# Patient Record
Sex: Male | Born: 1995 | Race: White | Hispanic: No | Marital: Single | Smoking: Current every day smoker
Health system: Southern US, Community
[De-identification: ages and names within clinical notes are randomized; demographics above are authoritative.]

## PROBLEM LIST (undated history)

## (undated) HISTORY — PX: TONSILLECTOMY: SUR1361

---
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
## 2017-01-04 ENCOUNTER — Emergency Department
Admission: EM | Admit: 2017-01-04 | Discharge: 2017-01-04 | Disposition: A | Discharge status: Home / Self Care | Payer: 59 | Attending: Emergency Medicine | Admitting: Emergency Medicine

## 2017-01-04 ENCOUNTER — Emergency Department: Payer: 59

## 2017-01-04 ENCOUNTER — Encounter: Payer: Self-pay | Admitting: Emergency Medicine

## 2017-01-04 DIAGNOSIS — R55 Syncope and collapse: Secondary | ICD-10-CM | POA: Diagnosis not present

## 2017-01-04 DIAGNOSIS — R1013 Epigastric pain: Secondary | ICD-10-CM | POA: Diagnosis not present

## 2017-01-04 DIAGNOSIS — F17220 Nicotine dependence, chewing tobacco, uncomplicated: Secondary | ICD-10-CM | POA: Diagnosis not present

## 2017-01-04 DIAGNOSIS — R42 Dizziness and giddiness: Secondary | ICD-10-CM | POA: Diagnosis present

## 2017-01-04 DIAGNOSIS — F17210 Nicotine dependence, cigarettes, uncomplicated: Secondary | ICD-10-CM | POA: Insufficient documentation

## 2017-01-04 LAB — BASIC METABOLIC PANEL
Anion gap: 9 (ref 5–15)
BUN: 10 mg/dL (ref 6–20)
CALCIUM: 9.6 mg/dL (ref 8.9–10.3)
CO2: 26 mmol/L (ref 22–32)
CREATININE: 0.94 mg/dL (ref 0.61–1.24)
Chloride: 105 mmol/L (ref 101–111)
GFR calc non Af Amer: >60 mL/min (ref >60)
Glucose, Bld: 69 mg/dL (ref 65–99)
Potassium: 3.4 mmol/L — ABNORMAL LOW (ref 3.5–5.1)
SODIUM: 140 mmol/L (ref 135–145)

## 2017-01-04 LAB — HEPATIC FUNCTION PANEL
ALT: 17 U/L (ref 17–63)
AST: 19 U/L (ref 15–41)
Albumin: 4.3 g/dL (ref 3.5–5.0)
Alkaline Phosphatase: 69 U/L (ref 38–126)
BILIRUBIN TOTAL: 0.7 mg/dL (ref 0.3–1.2)
Total Protein: 7.2 g/dL (ref 6.5–8.1)

## 2017-01-04 LAB — CBC
HCT: 45.3 % (ref 40.0–52.0)
Hemoglobin: 15.4 g/dL (ref 13.0–18.0)
MCH: 31.9 pg (ref 26.0–34.0)
MCHC: 33.9 g/dL (ref 32.0–36.0)
MCV: 94.2 fL (ref 80.0–100.0)
PLATELETS: 246 10*3/uL (ref 150–440)
RBC: 4.81 MIL/uL (ref 4.40–5.90)
RDW: 13.1 % (ref 11.5–14.5)
WBC: 8.1 10*3/uL (ref 3.8–10.6)

## 2017-01-04 LAB — URINALYSIS, COMPLETE (UACMP) WITH MICROSCOPIC
BILIRUBIN URINE: NEGATIVE
Bacteria, UA: NONE SEEN
GLUCOSE, UA: NEGATIVE mg/dL
Hgb urine dipstick: NEGATIVE
KETONES UR: NEGATIVE mg/dL
LEUKOCYTES UA: NEGATIVE
Nitrite: NEGATIVE
PH: 7 (ref 5.0–8.0)
Protein, ur: NEGATIVE mg/dL
SPECIFIC GRAVITY, URINE: 1.014 (ref 1.005–1.030)
Squamous Epithelial / LPF: NONE SEEN

## 2017-01-04 LAB — URINE DRUG SCREEN, QUALITATIVE (ARMC ONLY)
Amphetamines, Ur Screen: NOT DETECTED
BENZODIAZEPINE, UR SCRN: NOT DETECTED
Barbiturates, Ur Screen: NOT DETECTED
COCAINE METABOLITE, UR ~~LOC~~: NOT DETECTED
Cannabinoid 50 Ng, Ur ~~LOC~~: NOT DETECTED
MDMA (Ecstasy)Ur Screen: NOT DETECTED
Methadone Scn, Ur: NOT DETECTED
OPIATE, UR SCREEN: NOT DETECTED
PHENCYCLIDINE (PCP) UR S: NOT DETECTED
Tricyclic, Ur Screen: NOT DETECTED

## 2017-01-04 LAB — LIPASE, BLOOD: Lipase: 20 U/L (ref 11–51)

## 2017-01-04 MED ORDER — ACETAMINOPHEN 500 MG PO TABS
1000.0000 mg | ORAL_TABLET | Freq: Once | ORAL | Status: DC
  Filled 2017-01-04: qty 2

## 2017-01-04 MED ORDER — ACETAMINOPHEN 325 MG PO TABS
650.0000 mg | ORAL_TABLET | Freq: Once | ORAL | Status: AC
  Administered 2017-01-04: 650 mg via ORAL
  Filled 2017-01-04: qty 2

## 2017-01-04 NOTE — Discharge Instructions (Signed)

## 2017-01-04 NOTE — ED Provider Notes (Signed)
[facility] Emergency Department Provider Note  ____________________________________________  Time seen: Approximately 8:31 PM  I have reviewed the triage vital signs and the nursing notes.   HISTORY  Chief Complaint Dizziness   HPI Vincent Watson is a 21 y.o. male no significant past medical history who presents for evaluation of dizziness and abdominal pain. Patient reports 2-3 weeks of episodes of dizziness which happen every 3-4 days. He reports that these episodes can happen at any time and not necessarily associated with changes in position. He feels lightheaded and breaks into a sweat. He reports that he came very close to passing out but never fully lost consciousness. No family history of sudden death. These episodes do not happen with exertion. The last episode was 5 days ago. Today patient reports that his been having epigastric abdominal pain, feeling very weak and tired, and has developed congestion as well. His pain is constant, dull, located in the epigastric region with no radiation. No nausea, vomiting, diarrhea, constipation, dysuria, hematuria, cough, shortness of breath.He is a smoker, denies alcohol and drugs.  History reviewed. No pertinent past medical history.  There are no active problems to display for this patient.   Past Surgical History:  Procedure Laterality Date  . HAND SURGERY Left 2017  . TONSILLECTOMY      Prior to Admission medications   Not on File    Allergies Patient has no known allergies.  No family history on file.  Social History Social History  Substance Use Topics  . Smoking status: Current Every Day Smoker    Packs/day: 0.50    Types: Cigarettes  . Smokeless tobacco: Current User    Types: Snuff  . Alcohol use No    Review of Systems  Constitutional: Negative for fever. + dizziness Eyes: Negative for visual changes. ENT: Negative for sore throat. Neck: No neck pain    Cardiovascular: Negative for chest pain. Respiratory: Negative for shortness of breath. Gastrointestinal: + epigastric abdominal pain. No vomiting or diarrhea. Genitourinary: Negative for dysuria. Musculoskeletal: Negative for back pain. Skin: Negative for rash. Neurological: Negative for headaches, weakness or numbness. Psych: No SI or HI  ____________________________________________   PHYSICAL EXAM:  VITAL SIGNS: ED Triage Vitals [01/04/17 1914]  Enc Vitals Group     BP (!) 146/98     Pulse Rate 81     Resp 18     Temp 98.8 F (37.1 C)     Temp Source Oral     SpO2 98 %     Weight 130 lb (59 kg)     Height 6' (1.829 m)     Head Circumference      Peak Flow      Pain Score      Pain Loc      Pain Edu?      Excl. in GC?     Constitutional: Alert and oriented. Well appearing and in no apparent distress. HEENT:      Head: Normocephalic and atraumatic.         Eyes: Conjunctivae are normal. Sclera is non-icteric.       Mouth/Throat: Mucous membranes are moist.       Neck: Supple with no signs of meningismus. Cardiovascular: Regular rate and rhythm. No murmurs, gallops, or rubs. 2+ symmetrical distal pulses are present in all extremities. No JVD. Respiratory: Normal respiratory effort. Lungs are clear to auscultation bilaterally. No wheezes, crackles, or rhonchi.  Gastrointestinal: Soft, mild ttp over the epigastric  region, and non distended with positive bowel sounds. No rebound or guarding. Genitourinary: No CVA tenderness. Musculoskeletal: Nontender with normal range of motion in all extremities. No edema, cyanosis, or erythema of extremities. Neurologic: Normal speech and language. Face is symmetric. Moving all extremities. No gross focal neurologic deficits are appreciated. Skin: Skin is warm, dry and intact. No rash noted. Psychiatric: Mood and affect are normal. Speech and behavior are normal.  ____________________________________________   LABS (all labs  ordered are listed, but only abnormal results are displayed)  Labs Reviewed  BASIC METABOLIC PANEL - Abnormal; Notable for the following:       Result Value   Potassium 3.4 (*)    All other components within normal limits  URINALYSIS, COMPLETE (UACMP) WITH MICROSCOPIC - Abnormal; Notable for the following:    Color, Urine YELLOW (*)    APPearance TURBID (*)    All other components within normal limits  HEPATIC FUNCTION PANEL - Abnormal; Notable for the following:    Bilirubin, Direct <0.1 (*)    All other components within normal limits  CBC  URINE DRUG SCREEN, QUALITATIVE ([facility] ONLY)  LIPASE, BLOOD  CBG MONITORING, ED   ____________________________________________  EKG  ED ECG REPORT I, Nita Sickle, the attending physician, personally viewed and interpreted this ECG.  Normal sinus rhythm, normal intervals, normal axis, no STE or depressions, no evidence of HOCM, AV block, delta wave, ARVD, prolonged QTc, WPW, or Brugada.   ____________________________________________  RADIOLOGY  CXR: Mild left basilar subsegmental atelectasis. No other active cardiopulmonary disease. ____________________________________________   PROCEDURES  Procedure(s) performed: None Procedures Critical Care performed:  None ____________________________________________   INITIAL IMPRESSION / ASSESSMENT AND PLAN / ED COURSE  21 y.o. male no significant past medical history who presents for evaluation of dizziness and abdominal pain.  # near syncope: for several weeks, not associated with exertion. EKG with no concerning dysrhythmia. Labs with no electrolyte abnormalities, no dehydration, no anemia. Will get drug screen. Plan to refer patient to cardiology for holter monitoring  # abdominal pain/ congestion/ fatigue: mist likely viral. Patient is well appearing. LFTs and lipase added to original blood work. Will get CXR to rule out PNA. Will give tylenol.    _________________________ 9:14  PM on 01/04/2017 -----------------------------------------  LFTs and lipase negative. Will dc home on supportive care. Discussed return precautions with patient and father   As part of my medical decision making, I reviewed the following data within the electronic MEDICAL RECORD NUMBER Nursing notes reviewed and incorporated, Labs reviewed , EKG interpreted , Radiograph reviewed , Notes from prior ED visits and [place] Controlled Substance Database    Pertinent labs & imaging results that were available during my care of the patient were reviewed by me and considered in my medical decision making (see chart for details).    ____________________________________________   FINAL CLINICAL IMPRESSION(S) / ED DIAGNOSES  Final diagnoses:  Near syncope  Epigastric abdominal pain      NEW MEDICATIONS STARTED DURING THIS VISIT:  New Prescriptions   No medications on file     Note:  This document was prepared using Dragon voice recognition software and may include unintentional dictation errors.    Nita Sickle, MD 01/04/17 2115

## 2017-01-04 NOTE — ED Triage Notes (Signed)
FIRST NURSE NOTE-dizzy. Ambulatory with steady gait

## 2017-01-04 NOTE — ED Triage Notes (Addendum)
Pt in via POV with complaints of dizziness and hot flashes x approximately 2-3 weeks.  Pt denies any recent head injury.  Pt ambulatory to triage without difficulty.  Vitals WDL, NAD noted at this time.

## 2017-01-04 NOTE — ED Notes (Signed)
Pt discussed with Dr. Derrill KayGoodman; no new orders at this time.

## 2018-11-27 IMAGING — CR DG CHEST 2V
1 series · 2 of 2 positions shown · non-contrast
Comparison: None available.

CLINICAL DATA: Initial evaluation for acute dizziness, hot flashes.

EXAM:
CHEST  2 VIEW

[Series 1: dg chest 2 view · 0.14mm/px · 2 of 2 slices shown]
[im 1/2]
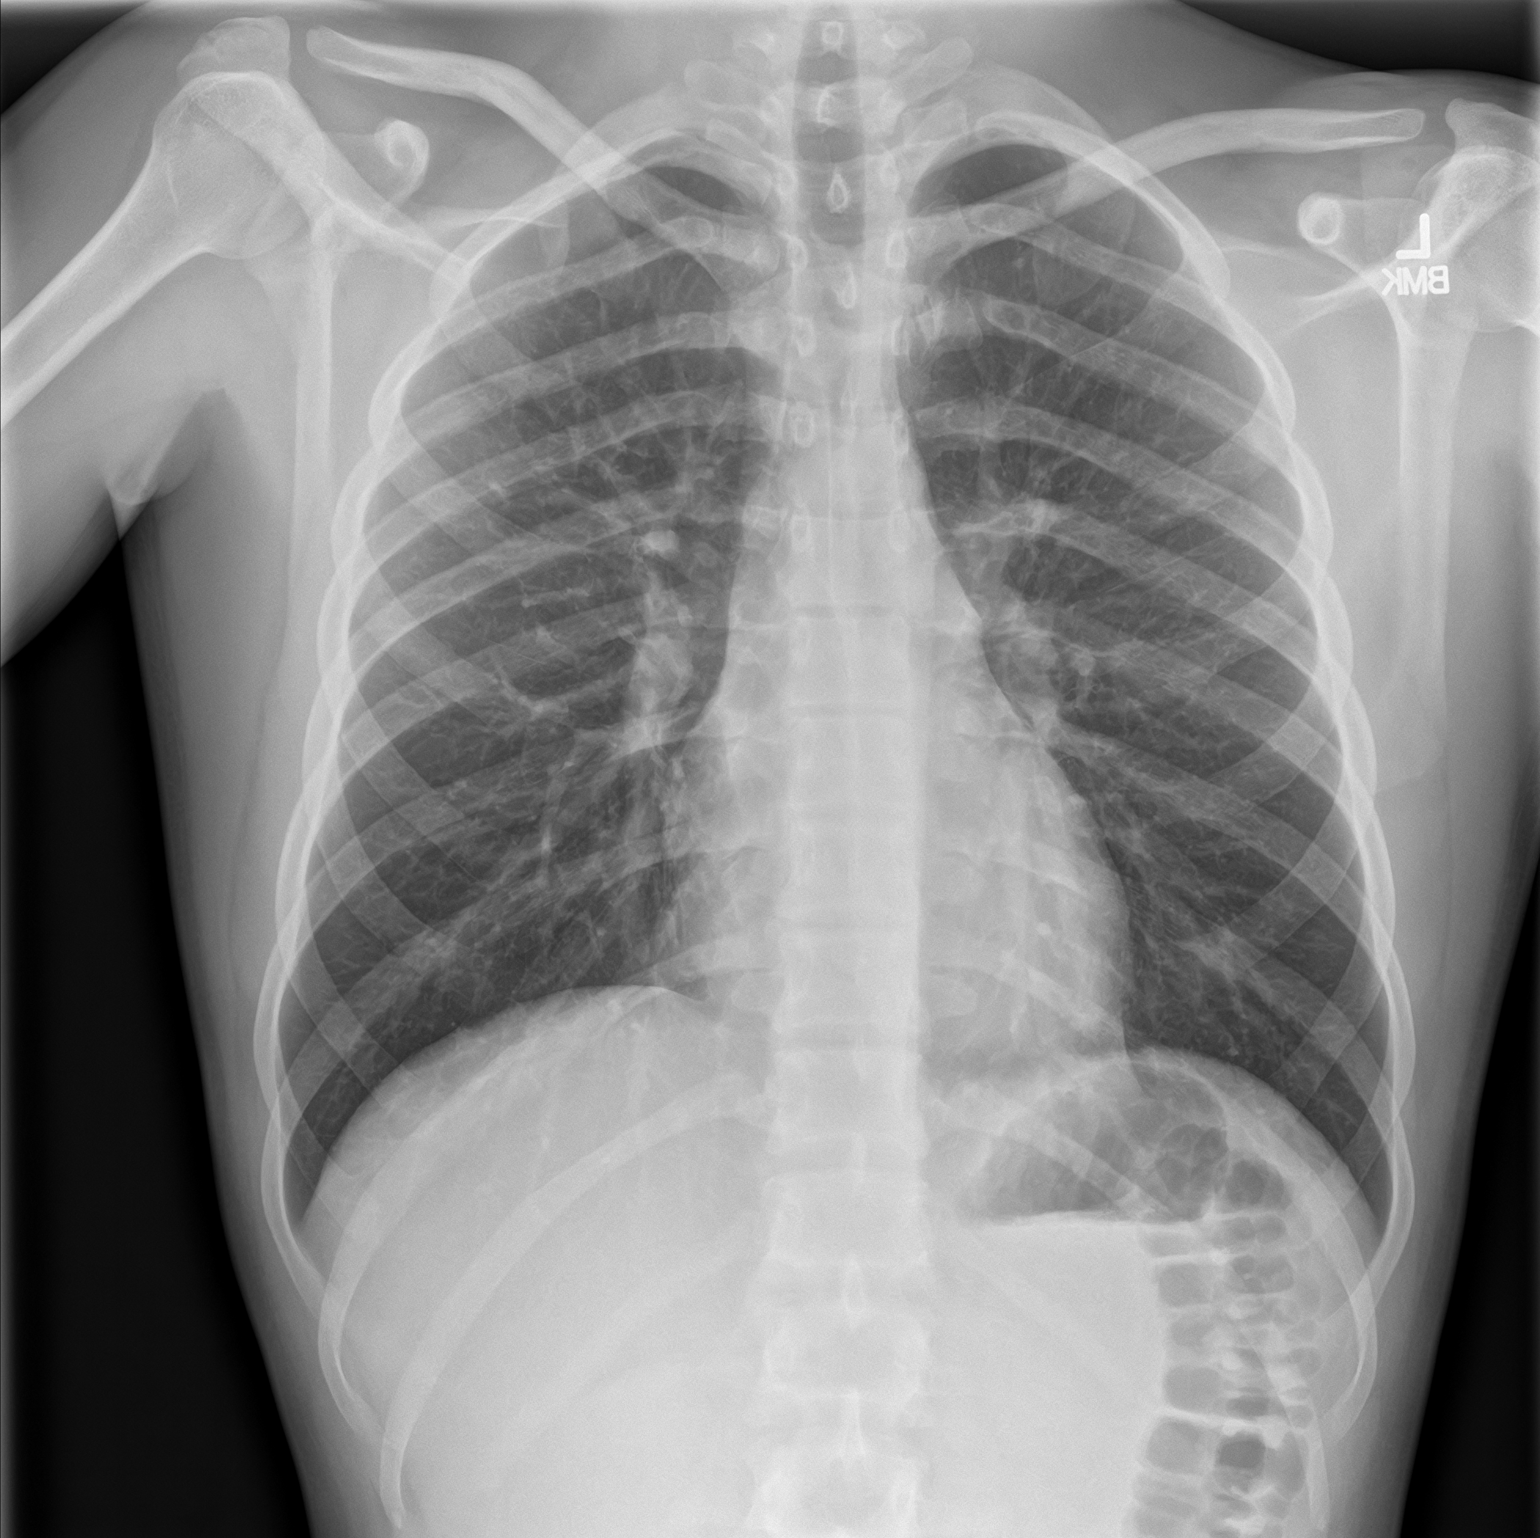
[im 2/2]
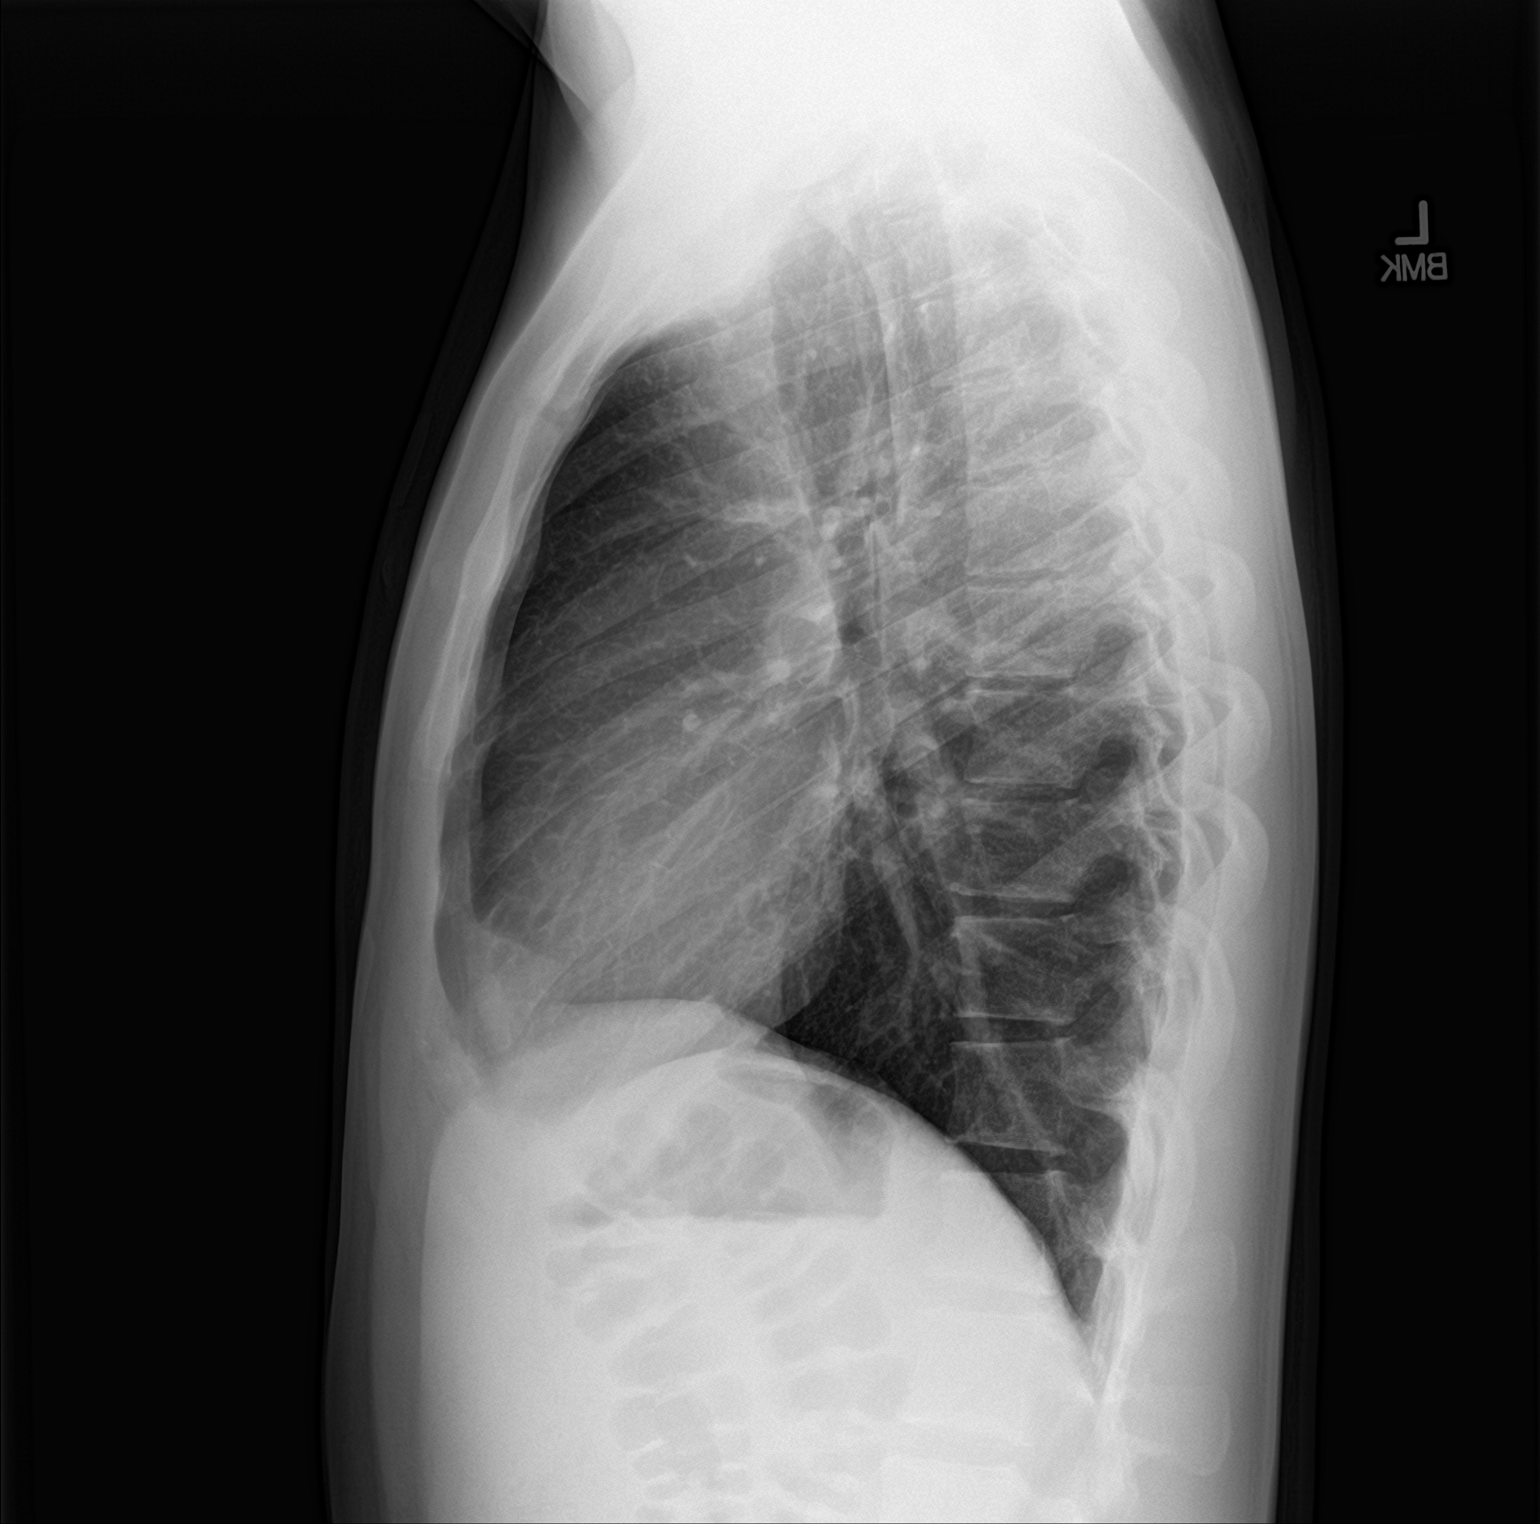

[2 of 2 positions shown; findings below may reference images not displayed]

FINDINGS: The cardiac and mediastinal silhouettes are stable in size and
contour, and remain within normal limits.

The lungs are normally inflated. Mild left basilar opacity favored
to reflect subsegmental atelectasis. No airspace consolidation,
pleural effusion, or pulmonary edema is identified. There is no
pneumothorax.

No acute osseous abnormality identified.
IMPRESSION: Mild left basilar subsegmental atelectasis. No other active
cardiopulmonary disease.
